# Patient Record
Sex: Male | Born: 1995 | Race: White | Hispanic: Yes | Marital: Single | Smoking: Current every day smoker
Health system: Southern US, Community
[De-identification: ages and names within clinical notes are randomized; demographics above are authoritative.]

## PROBLEM LIST (undated history)

## (undated) DIAGNOSIS — J45909 Unspecified asthma, uncomplicated: Secondary | ICD-10-CM

---
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
## 2017-04-21 ENCOUNTER — Telehealth: Payer: Self-pay | Admitting: Pediatrics

## 2017-04-21 NOTE — Telephone Encounter (Signed)
Mom came by requesting to have a DMV form completed. Please call [name] at [phone] or mom at [phone] when it is finished.

## 2017-04-21 NOTE — Telephone Encounter (Signed)
PE with Dr. Zenda AlpersSawyer 11/27/16 states "DMV paperwork filled out" but nothing appears scanned into media tab; DMV letter placed in Dr. Orlean BradfordProse's folder for review.

## 2017-04-23 ENCOUNTER — Encounter: Payer: Self-pay | Admitting: Pediatrics

## 2017-04-29 NOTE — Telephone Encounter (Signed)
Paperwork completed, signed and faxed by Dr. Lubertha SouthProse. Fax confirmation received.

## 2017-07-22 ENCOUNTER — Ambulatory Visit (INDEPENDENT_AMBULATORY_CARE_PROVIDER_SITE_OTHER): Payer: Self-pay | Admitting: *Deleted

## 2017-07-22 DIAGNOSIS — Z23 Encounter for immunization: Secondary | ICD-10-CM

## 2017-09-07 ENCOUNTER — Telehealth: Payer: Self-pay | Admitting: Pediatrics

## 2017-09-07 NOTE — Telephone Encounter (Signed)
Pt came in and drop off [name] DMV driving fitness form to be filled out. Please call Pt at [phone] with any question.

## 2017-09-09 ENCOUNTER — Encounter: Payer: Self-pay | Admitting: Pediatrics

## 2017-09-09 NOTE — Telephone Encounter (Signed)
Dr. Lubertha SouthProse is working on this.

## 2017-09-25 NOTE — Telephone Encounter (Signed)
Nothing found in media tab. Staff messaged Dr Lubertha SouthProse for update.

## 2017-09-28 NOTE — Telephone Encounter (Signed)
Dr. Lubertha SouthProse has completed form and will contact [name] for pick up.

## 2018-04-04 ENCOUNTER — Ambulatory Visit (HOSPITAL_COMMUNITY)
Admission: EM | Admit: 2018-04-04 | Discharge: 2018-04-04 | Disposition: A | Discharge status: Home / Self Care | Payer: Self-pay | Attending: Family Medicine | Admitting: Family Medicine

## 2018-04-04 ENCOUNTER — Other Ambulatory Visit: Payer: Self-pay

## 2018-04-04 ENCOUNTER — Encounter (HOSPITAL_COMMUNITY): Payer: Self-pay | Admitting: Emergency Medicine

## 2018-04-04 DIAGNOSIS — J4521 Mild intermittent asthma with (acute) exacerbation: Secondary | ICD-10-CM

## 2018-04-04 DIAGNOSIS — J4530 Mild persistent asthma, uncomplicated: Secondary | ICD-10-CM

## 2018-04-04 DIAGNOSIS — J4531 Mild persistent asthma with (acute) exacerbation: Secondary | ICD-10-CM

## 2018-04-04 MED ORDER — ALBUTEROL SULFATE HFA 108 (90 BASE) MCG/ACT IN AERS: 2.0000 | INHALATION_SPRAY | Freq: Four times a day (QID) | RESPIRATORY_TRACT | 0 refills | Status: DC | PRN

## 2018-04-04 MED ORDER — BECLOMETHASONE DIPROPIONATE 40 MCG/ACT IN AERS: 1.0000 | INHALATION_SPRAY | Freq: Two times a day (BID) | RESPIRATORY_TRACT | 1 refills | Status: DC

## 2018-04-04 MED ORDER — PREDNISONE 10 MG (21) PO TBPK: ORAL_TABLET | ORAL | 0 refills | Status: DC

## 2018-04-04 MED ORDER — ALBUTEROL SULFATE (2.5 MG/3ML) 0.083% IN NEBU
INHALATION_SOLUTION | RESPIRATORY_TRACT | Status: AC
  Filled 2018-04-04: qty 3

## 2018-04-04 MED ORDER — ALBUTEROL SULFATE (2.5 MG/3ML) 0.083% IN NEBU
2.5000 mg | INHALATION_SOLUTION | Freq: Once | RESPIRATORY_TRACT | Status: AC
  Administered 2018-04-04: 2.5 mg via RESPIRATORY_TRACT

## 2018-04-04 NOTE — ED Notes (Signed)
Pt discharged by provider.

## 2018-04-04 NOTE — ED Triage Notes (Signed)
[facility] a [facility] of exacerbation of his asthma that started yesterday.

## 2018-04-04 NOTE — ED Provider Notes (Signed)
[facility]    CSN: [id number] Arrival date & time: 04/04/18  1413     History   Chief Complaint Chief Complaint  Patient presents with  . Shortness of Breath    HPI Enis SlipperVictor C Jarema is a 22 y.o. male.   Patient is a 22 year old male here for asthma attack onset yesterday. He stopped his Qvar 1 year ago due to lack of refill and have been feeling fine the whole time until now. Current asthma attack is triggered by URI. He reports nose, coughing, congestion, wheezing and shortness of breath.  Albuterol nebulizer treatment was given by clinical staff per protocol and has completed when this clinician entered the room to assess patient. Patient reports feeling much better after the nebulizer treatment.       Past Medical History:  Diagnosis Date  . Asthma    hosp age 60, and then at least 15 times at ED    Patient Active Problem List   Diagnosis Date Noted  . Weight loss 11/12/2016  . Acne vulgaris 10/27/2014  . Perennial allergic rhinitis 08/17/2014  . Asthma 06/05/2014    Past Surgical History:  Procedure Laterality Date  . 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 Authorizing Provider  ibuprofen (ADVIL,MOTRIN) 800 MG tablet Take 1 tablet (800 mg total) by mouth 3 (three) times daily. 11/16/16  Yes Pollina, Canary Brimhristopher J, MD  albuterol (PROAIR HFA) 108 (90 Base) MCG/ACT inhaler Inhale 2 puffs into the lungs every 6 (six) hours as needed for up to 5 days for wheezing. Always use spacer. 04/04/18 04/09/18  Lucia EstelleZheng, [place], NP  beclomethasone (QVAR) 40 MCG/ACT inhaler Inhale 1 puff into the lungs 2 (two) times daily. Always use spacer. 04/04/18 05/04/18  Lucia EstelleZheng, Alioune Hodgkin, NP  predniSONE (STERAPRED UNI-PAK 21 TAB) 10 MG (21) TBPK tablet Take as directed 04/04/18   Lucia EstelleZheng, Zniyah Midkiff, NP    Family History Family History  Problem Relation Age of Onset  . Diabetes Father   .  Asthma Sister   . Asthma Maternal Aunt        great aunts; and several cousins  . Hypertension Maternal Aunt   . Asthma Maternal Grandmother   . Cancer Maternal Grandmother [id number]       lung apparently primary  . Diabetes Maternal Grandmother   . Diabetes Paternal Grandmother   . Diabetes Paternal Grandfather   . Learning disabilities Neg Hx   . Alcohol abuse Neg Hx   . Drug abuse Neg Hx     Social History Social History   Tobacco Use  . Smoking status: Current Every Day Smoker  . Smokeless tobacco: Never Used  . Tobacco comment:  once a day marjuiana  Substance Use Topics  . Alcohol use: No    Alcohol/week: 2.4 oz    Types: 4 Shots of liquor per week  . Drug use: Yes    Types: Marijuana     Allergies   Penicillins   Review of Systems Review of Systems  Constitutional:       As stated in the HPI     Physical Exam Triage Vital Signs ED Triage Vitals  Enc Vitals Group     BP 04/04/18 1456 (!) 141/85     Pulse Rate 04/04/18 1456 92     Resp 04/04/18 1456 18     Temp 04/04/18 1456 99.5  F (37.5 C)     Temp Source 04/04/18 1456 Oral     SpO2 04/04/18 1456 98 %     Weight --      Height --      Head Circumference --      Peak Flow --      Pain Score 04/04/18 1455 2     Pain Loc --      Pain Edu? --      Excl. in GC? --    No data found.  Updated Vital Signs BP (!) 141/85 (BP Location: Left Arm)   Pulse 92   Temp 99.5 F (37.5 C) (Oral)   Resp 18   SpO2 98%   Physical Exam  Constitutional: He is oriented to person, place, and time. He appears well-developed and well-nourished.  Non-toxic appearance. He does not appear ill. No distress.  HENT:  Head: Normocephalic and atraumatic.  Eyes: Pupils are equal, round, and reactive to light.  Neck: Normal range of motion.  Cardiovascular: Normal rate and normal heart sounds.  No murmur heard. Pulmonary/Chest: Effort normal and breath sounds normal. No respiratory distress.  Was wheezing per patient.     Abdominal: Soft. Bowel sounds are normal. There is no tenderness.  Lymphadenopathy:    He has no cervical adenopathy.  Neurological: He is alert and oriented to person, place, and time.  Nursing note and vitals reviewed.    UC Treatments / Results  Labs (all labs ordered are listed, but only abnormal results are displayed) Labs Reviewed - No data to display  EKG None  Radiology No results found.  Procedures Procedures (including critical care time)  Medications Ordered in UC Medications  albuterol (PROVENTIL) (2.5 MG/3ML) 0.083% nebulizer solution 2.5 mg (2.5 mg Nebulization Given 04/04/18 1504)    Initial Impression / Assessment and Plan / UC Course  I have reviewed the triage vital signs and the nursing notes.  Pertinent labs & imaging results that were available during my care of the patient were reviewed by me and considered in my medical decision making (see chart for details).   Final Clinical Impressions(s) / UC Diagnoses   Final diagnoses:  Mild intermittent asthma with exacerbation  Patient responded well to albuterol nebulizer treatment. Lungs were CTA on exam after treatment. We discussed thoroughly about his asthma management. Patient agreed to take 6-day prednisone taper for his current flare up and albuterol inhaler Q6 hr PRN for wheezing.   Agreed to continue to hold the Qvar for now. Only restart Qvar if flare up becomes frequent. Advised to f/u with a PCP but he only has medicaid family plan.     Discharge Instructions     I have send in prednisone and albuterol inhaler electronically to your pharmacy. I decided to print out your Qvar prescription, which is attached to his paperwork.   Please take the medications as discussed. Return or follow up with a primary care doctor if you do not improve.        ED Prescriptions    Medication Sig Dispense Auth. Provider   beclomethasone (QVAR) 40 MCG/ACT inhaler Inhale 1 puff into the lungs 2 (two)  times daily. Always use spacer. 1 Inhaler Lucia Estelle, NP   predniSONE (STERAPRED UNI-PAK 21 TAB) 10 MG (21) TBPK tablet Take as directed 21 tablet Lucia Estelle, NP   albuterol (PROAIR HFA) 108 (90 Base) MCG/ACT inhaler Inhale 2 puffs into the lungs every 6 (six) hours as needed for up to 5 days  for wheezing. Always use spacer. 1 Inhaler Lucia Estelle, NP     Controlled Substance Prescriptions [place] Controlled Substance Registry consulted? Not Applicable   Lucia Estelle, NP 04/04/18 1531

## 2018-04-04 NOTE — Discharge Instructions (Addendum)
I have send in prednisone and albuterol inhaler electronically to your pharmacy. I decided to print out your Qvar prescription, which is attached to his paperwork.   Please take the medications as discussed. Return or follow up with a primary care doctor if you do not improve.
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
## 2020-06-14 ENCOUNTER — Encounter: Payer: Self-pay | Admitting: Pediatrics

## 2022-09-14 ENCOUNTER — Other Ambulatory Visit: Payer: Self-pay

## 2022-09-14 ENCOUNTER — Emergency Department (HOSPITAL_COMMUNITY)
Admission: EM | Admit: 2022-09-14 | Discharge: 2022-09-14 | Disposition: A | Discharge status: Home / Self Care | Payer: Medicaid Other | Attending: Student | Admitting: Student

## 2022-09-14 ENCOUNTER — Emergency Department (HOSPITAL_COMMUNITY): Payer: Medicaid Other

## 2022-09-14 ENCOUNTER — Encounter (HOSPITAL_COMMUNITY): Payer: Self-pay

## 2022-09-14 DIAGNOSIS — F17200 Nicotine dependence, unspecified, uncomplicated: Secondary | ICD-10-CM | POA: Diagnosis not present

## 2022-09-14 DIAGNOSIS — S52615A Nondisplaced fracture of left ulna styloid process, initial encounter for closed fracture: Secondary | ICD-10-CM | POA: Insufficient documentation

## 2022-09-14 DIAGNOSIS — J45909 Unspecified asthma, uncomplicated: Secondary | ICD-10-CM | POA: Insufficient documentation

## 2022-09-14 DIAGNOSIS — Y92410 Unspecified street and highway as the place of occurrence of the external cause: Secondary | ICD-10-CM | POA: Insufficient documentation

## 2022-09-14 DIAGNOSIS — S060X0A Concussion without loss of consciousness, initial encounter: Secondary | ICD-10-CM | POA: Diagnosis not present

## 2022-09-14 DIAGNOSIS — S0990XA Unspecified injury of head, initial encounter: Secondary | ICD-10-CM | POA: Diagnosis present

## 2022-09-14 MED ORDER — NAPROXEN 375 MG PO TABS: 375.0000 mg | ORAL_TABLET | Freq: Two times a day (BID) | ORAL | 0 refills | Status: DC

## 2022-09-14 MED ORDER — ONDANSETRON 4 MG PO TBDP: 4.0000 mg | ORAL_TABLET | Freq: Three times a day (TID) | ORAL | 0 refills | Status: DC | PRN

## 2022-09-14 MED ORDER — ONDANSETRON 4 MG PO TBDP
4.0000 mg | ORAL_TABLET | Freq: Once | ORAL | Status: AC
  Administered 2022-09-14: 4 mg via ORAL
  Filled 2022-09-14: qty 1
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
## 2022-09-14 NOTE — Progress Notes (Signed)
Orthopedic Tech Progress Note Patient Details:  KASTEN LEVEQUE [birth year] [id number]  Ortho Devices Type of Ortho Device: Velcro wrist splint Ortho Device/Splint Location: LUE Ortho Device/Splint Interventions: Ordered, Application, Adjustment   Post Interventions Patient Tolerated: Well Instructions Provided: Adjustment of device, Care of device, Poper ambulation with device  Vegas Coffin 09/14/2022, 2:30 PM
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
## 2022-09-29 ENCOUNTER — Ambulatory Visit (INDEPENDENT_AMBULATORY_CARE_PROVIDER_SITE_OTHER): Payer: Medicaid Other | Admitting: Physician Assistant

## 2022-09-29 ENCOUNTER — Ambulatory Visit (INDEPENDENT_AMBULATORY_CARE_PROVIDER_SITE_OTHER): Payer: Medicaid Other

## 2022-09-29 ENCOUNTER — Encounter: Payer: Self-pay | Admitting: Physician Assistant

## 2022-09-29 DIAGNOSIS — M25532 Pain in left wrist: Secondary | ICD-10-CM
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
## 2022-10-07 ENCOUNTER — Emergency Department (HOSPITAL_COMMUNITY)
Admission: EM | Admit: 2022-10-07 | Discharge: 2022-10-07 | Discharge status: Against Medical Advice | Payer: Medicaid Other | Attending: Emergency Medicine | Admitting: Emergency Medicine

## 2022-10-07 ENCOUNTER — Other Ambulatory Visit: Payer: Self-pay

## 2022-10-07 DIAGNOSIS — Z5321 Procedure and treatment not carried out due to patient leaving prior to being seen by health care provider: Secondary | ICD-10-CM | POA: Insufficient documentation

## 2022-10-07 DIAGNOSIS — R1010 Upper abdominal pain, unspecified: Secondary | ICD-10-CM | POA: Insufficient documentation

## 2022-10-07 DIAGNOSIS — R1110 Vomiting, unspecified: Secondary | ICD-10-CM | POA: Diagnosis not present

## 2022-10-07 LAB — CBC
HCT: 44.1 % (ref 39.0–52.0)
Hemoglobin: 15.1 g/dL (ref 13.0–17.0)
MCH: 30.9 pg (ref 26.0–34.0)
MCHC: 34.2 g/dL (ref 30.0–36.0)
MCV: 90.4 fL (ref 80.0–100.0)
Platelets: 196 10*3/uL (ref 150–400)
RBC: 4.88 MIL/uL (ref 4.22–5.81)
RDW: 12.4 % (ref 11.5–15.5)
WBC: 8.4 10*3/uL (ref 4.0–10.5)
nRBC: 0 % (ref 0.0–0.2)

## 2022-10-07 LAB — COMPREHENSIVE METABOLIC PANEL
ALT: 31 U/L (ref 0–44)
AST: 62 U/L — ABNORMAL HIGH (ref 15–41)
Albumin: 4.2 g/dL (ref 3.5–5.0)
Alkaline Phosphatase: 84 U/L (ref 38–126)
Anion gap: 8 (ref 5–15)
BUN: 13 mg/dL (ref 6–20)
CO2: 29 mmol/L (ref 22–32)
Calcium: 9.2 mg/dL (ref 8.9–10.3)
Chloride: 106 mmol/L (ref 98–111)
Creatinine, Ser: 0.88 mg/dL (ref 0.61–1.24)
GFR, Estimated: >60 mL/min (ref >60)
Glucose, Bld: 132 mg/dL — ABNORMAL HIGH (ref 70–99)
Potassium: 3.4 mmol/L — ABNORMAL LOW (ref 3.5–5.1)
Sodium: 143 mmol/L (ref 135–145)
Total Bilirubin: 0.9 mg/dL (ref 0.3–1.2)
Total Protein: 6.9 g/dL (ref 6.5–8.1)

## 2022-10-07 LAB — URINALYSIS, ROUTINE W REFLEX MICROSCOPIC
Bilirubin Urine: NEGATIVE
Glucose, UA: NEGATIVE mg/dL
Hgb urine dipstick: NEGATIVE
Ketones, ur: NEGATIVE mg/dL
Leukocytes,Ua: NEGATIVE
Nitrite: NEGATIVE
Protein, ur: NEGATIVE mg/dL
Specific Gravity, Urine: 1.019 (ref 1.005–1.030)
pH: 8 (ref 5.0–8.0)

## 2022-10-07 LAB — LIPASE, BLOOD: Lipase: 47 U/L (ref 11–51)

## 2022-10-07 NOTE — ED Triage Notes (Signed)
Patient arrived with EMS from home reports pain across upper abdomen this evening with emesis x2 , no fever or diarrhea .

## 2022-10-07 NOTE — ED Notes (Signed)
Pt stated he was leaving. Pt IV removed by this tech.

## 2022-10-27 ENCOUNTER — Ambulatory Visit (INDEPENDENT_AMBULATORY_CARE_PROVIDER_SITE_OTHER): Payer: Medicaid Other | Admitting: Physician Assistant

## 2022-10-27 ENCOUNTER — Encounter: Payer: Self-pay | Admitting: Physician Assistant

## 2022-10-27 ENCOUNTER — Ambulatory Visit: Payer: Self-pay

## 2022-10-27 DIAGNOSIS — M25532 Pain in left wrist: Secondary | ICD-10-CM

## 2022-10-27 NOTE — Progress Notes (Deleted)
   Office Visit Note   Patient: Corey Rodgers           Date of Birth: 25-Oct-1995           MRN: [id number] Visit Date: 10/27/2022              Requested by: Christean Leaf, MD No address on file PCP: Pcp, No   Assessment & Plan: Visit Diagnoses:  1. Pain in left wrist     Plan: ***  Follow-Up Instructions: No follow-ups on file.   Orders:  Orders Placed This Encounter  Procedures   XR Wrist Complete Left   No orders of the defined types were placed in this encounter.     Procedures: No procedures performed   Clinical Data: No additional findings.   Subjective: Chief Complaint  Patient presents with   Left Wrist - Follow-up    HPI  Review of Systems   Objective: Vital Signs: There were no vitals taken for this visit.  Physical Exam  Ortho Exam  Specialty Comments:  No specialty comments available.  Imaging: No results found.   PMFS History: Patient Active Problem List   Diagnosis Date Noted   Weight loss 11/12/2016   Acne vulgaris 10/27/2014   Perennial allergic rhinitis 08/17/2014   Asthma 06/05/2014   Past Medical History:  Diagnosis Date   Asthma    hosp age 38, and then at least 57 times at ED    Family History  Problem Relation Age of Onset   Diabetes Father    Asthma Sister    Asthma Maternal Aunt        great aunts; and several cousins   Hypertension Maternal Aunt    Asthma Maternal Grandmother    Cancer Maternal Grandmother 38       lung apparently primary   Diabetes Maternal Grandmother    Diabetes Paternal Grandmother    Diabetes Paternal Grandfather    Learning disabilities Neg Hx    Alcohol abuse Neg Hx    Drug abuse Neg Hx     Past Surgical History:  Procedure Laterality Date   thyroglosal duct cyst  2003   excised   Social History   Occupational History   Not on file  Tobacco Use   Smoking status: Every Day   Smokeless tobacco: Never   Tobacco comments:     once a day marjuiana  Substance and  Sexual Activity   Alcohol use: No    Alcohol/week: 4.0 standard drinks of alcohol    Types: 4 Shots of liquor per week   Drug use: Yes    Types: Marijuana   Sexual activity: Never
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
## 2022-11-03 ENCOUNTER — Ambulatory Visit: Payer: Self-pay | Admitting: Nurse Practitioner

## 2022-11-27 ENCOUNTER — Ambulatory Visit (INDEPENDENT_AMBULATORY_CARE_PROVIDER_SITE_OTHER): Payer: Medicaid Other

## 2022-11-27 ENCOUNTER — Encounter: Payer: Self-pay | Admitting: Physician Assistant

## 2022-11-27 ENCOUNTER — Ambulatory Visit (INDEPENDENT_AMBULATORY_CARE_PROVIDER_SITE_OTHER): Payer: Medicaid Other | Admitting: Physician Assistant

## 2022-11-27 DIAGNOSIS — M25532 Pain in left wrist: Secondary | ICD-10-CM

## 2022-11-27 NOTE — Progress Notes (Signed)
HPI: Corey Rodgers returns today for follow-up of his left wrist.  He states his left wrist is overall doing well.  He is back to review.  He is no longer using the wrist splint.  He states he has been back to review for the last few weeks without any significant pain.  Review of systems: See HPI otherwise negative  Physical exam general well-developed well-nourished male no acute distress. Right wrist full range of motion without pain.  He is nontender over the distal ulna.  He has full supination pronation of the left forearm.  Full range of motion of the elbow without pain.  Hand neurovascular intact.  Radiographs: 3 views left wrist show the ulnar styloid fracture with good consolidation.  There is no other fractures identified.  Wrist is well located.  No other bony abnormalities.  Impression: Left ulnar styloid fracture  Plan: He is activities as tolerated.  Follow-up as needed.  Questions were encouraged and answered.

## 2022-12-18 ENCOUNTER — Encounter: Payer: Self-pay | Admitting: Radiology

## 2023-10-05 ENCOUNTER — Encounter (HOSPITAL_COMMUNITY): Payer: Self-pay | Admitting: *Deleted

## 2023-10-05 ENCOUNTER — Ambulatory Visit (HOSPITAL_COMMUNITY)
Admission: EM | Admit: 2023-10-05 | Discharge: 2023-10-05 | Disposition: A | Discharge status: Home / Self Care | Payer: Medicaid Other | Attending: Physician Assistant | Admitting: Physician Assistant

## 2023-10-05 ENCOUNTER — Other Ambulatory Visit: Payer: Self-pay

## 2023-10-05 DIAGNOSIS — K1379 Other lesions of oral mucosa: Secondary | ICD-10-CM | POA: Diagnosis not present

## 2023-10-05 MED ORDER — NYSTATIN 100000 UNIT/ML MT SUSP: 5.0000 mL | Freq: Four times a day (QID) | OROMUCOSAL | 0 refills | Status: AC | PRN

## 2023-10-05 NOTE — ED Triage Notes (Signed)
Pt reports pain in the upper part of mouth. Pt reports he can not eat due to pain in the roof of mouth.

## 2023-10-05 NOTE — ED Provider Notes (Signed)
[facility]    CSN: [id number] Arrival date & time: 10/05/23  1119      History   Chief Complaint Chief Complaint  Patient presents with   Dental Pain    HPI Corey Rodgers is a 27 y.o. male.   Patient presents today with a history of pain along the roof of his mouth.  He denies any known trauma or thermal injury to the area.  Denies any changes to personal hygiene products including toothpaste or mouthwash.  He has no pain with mastication and denies any pain around a tooth; it is only on his hard palate when something touches the area where he attempts to swallow.  He is able to manage his secretions but has had decreased oral intake.  During episodes pain is rated 7 on a 0-10 pain scale, described as sharp, no alleviating factors identified.  He denies any swelling of his throat, shortness of breath, muffled voice.  He denies any recent antibiotics or steroid use.  He does have a remote history of asthma but does not take inhaled corticosteroids.  He denies history of HIV or diabetes; does report that he was recently diagnosed with prediabetes.  He denies any chemotherapy exposure.    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 Authorizing Provider  magic mouthwash (nystatin, lidocaine, diphenhydrAMINE) suspension Take 5 mLs by mouth 4 (four) times daily as needed for mouth pain. Swish and spit. 10/05/23  Yes Fritzie Prioleau, Noberto Retort, PA-C    Family History Family History  Problem Relation Age of Onset   Diabetes Father    Asthma Sister    Asthma  Maternal Aunt        great aunts; and several cousins   Hypertension Maternal Aunt    Asthma Maternal Grandmother    Cancer Maternal Grandmother 37       lung apparently primary   Diabetes Maternal Grandmother    Diabetes Paternal Grandmother    Diabetes Paternal Grandfather    Learning disabilities Neg Hx    Alcohol abuse Neg Hx    Drug abuse Neg Hx     Social History Social History   Tobacco Use   Smoking status: Every Day   Smokeless tobacco: Never   Tobacco comments:     once a day marjuiana  Substance Use Topics   Alcohol use: No    Alcohol/week: 4.0 standard drinks of alcohol    Types: 4 Shots of liquor per week   Drug use: Yes    Types: Marijuana     Allergies   Penicillins   Review of Systems Review of Systems  Constitutional:  Negative for activity change, appetite change, fatigue and fever.  HENT:  Positive for sore throat. Negative for congestion, sinus pressure, sneezing, trouble swallowing and voice change.   Respiratory:  Negative for cough and shortness of breath.   Cardiovascular:  Negative for chest pain.  Gastrointestinal:  Negative for abdominal pain, diarrhea, nausea and  vomiting.     Physical Exam Triage Vital Signs ED Triage Vitals  Encounter Vitals Group     BP 10/05/23 1135 (!) 146/98     Systolic BP Percentile --      Diastolic BP Percentile --      Pulse Rate 10/05/23 1135 61     Resp 10/05/23 1135 18     Temp 10/05/23 1135 97.9 F (36.6 C)     Temp src --      SpO2 10/05/23 1135 96 %     Weight --      Height --      Head Circumference --      Peak Flow --      Pain Score 10/05/23 1133 7     Pain Loc --      Pain Education --      Exclude from Growth Chart --    No data found.  Updated Vital Signs BP (!) 146/98   Pulse 61   Temp 97.9 F (36.6 C)   Resp 18   SpO2 96%   Visual Acuity Right Eye Distance:   Left Eye Distance:   Bilateral Distance:    Right Eye Near:   Left Eye Near:    Bilateral Near:      Physical Exam Vitals reviewed.  Constitutional:      General: He is awake.     Appearance: Normal appearance. He is well-developed. He is not ill-appearing.     Comments: Very pleasant male appears stated age in no acute distress sitting comfortably in exam room  HENT:     Head: Normocephalic and atraumatic.     Right Ear: Tympanic membrane, ear canal and external ear normal. Tympanic membrane is not erythematous or bulging.     Left Ear: Tympanic membrane, ear canal and external ear normal. Tympanic membrane is not erythematous or bulging.     Nose: Nose normal.     Mouth/Throat:     Mouth: No oral lesions.     Dentition: Normal dentition.     Tongue: No lesions.     Palate: No lesions.     Pharynx: Uvula midline. Posterior oropharyngeal erythema present. No oropharyngeal exudate or uvula swelling.     Tonsils: No tonsillar exudate or tonsillar abscesses. 1+ on the right. 1+ on the left.     Comments: Significant erythema with fine white film noted hard palate.  Unable to remove film with tongue depressor.  No active ulceration or lesion noted.  No evidence of dental abscess.  No evidence of Ludwig angina. Cardiovascular:     Rate and Rhythm: Normal rate and regular rhythm.     Heart sounds: Normal heart sounds, S1 normal and S2 normal. No murmur heard. Pulmonary:     Effort: Pulmonary effort is normal. No accessory muscle usage or respiratory distress.     Breath sounds: Normal breath sounds. No stridor. No wheezing, rhonchi or rales.     Comments: Clear to auscultation bilaterally Lymphadenopathy:     Head:     Right side of head: No submental, submandibular or tonsillar adenopathy.     Left side of head: No submental, submandibular or tonsillar adenopathy.     Cervical: No cervical adenopathy.  Neurological:     Mental Status: He is alert.  Psychiatric:        Behavior: Behavior is cooperative.      UC Treatments / Results  Labs (all labs ordered are listed, but only  abnormal results  are displayed) Labs Reviewed - No data to display  EKG   Radiology No results found.  Procedures Procedures (including critical care time)  Medications Ordered in UC Medications - No data to display  Initial Impression / Assessment and Plan / UC Course  I have reviewed the triage vital signs and the nursing notes.  Pertinent labs & imaging results that were available during my care of the patient were reviewed by me and considered in my medical decision making (see chart for details).     Patient is well-appearing, afebrile, nontoxic, nontachycardic.  No evidence of dental infection/abscess that would warrant initiation of systemic antibiotics.  Concern for irritation of the hard palate potentially related to thrush given white film with history of prediabetes.  Will treat with Magic mouthwash to help manage the symptoms.  He is to swish and spit this up to 4 times a day and can also use over-the-counter analgesics as well as warm salt water for additional symptom relief.  We discussed that if his symptoms do not improve within a few days or if anything worsens and he has oral lesions, swelling of his throat, shortness of breath, muffled voice, fever he needs to be seen emergently.  Strict return precautions given.  Work excuse note provided.  All questions answered to patient satisfaction.  Final Clinical Impressions(s) / UC Diagnoses   Final diagnoses:  Pain of internal part of mouth     Discharge Instructions      Use Magic mouthwash up to 4 times a day.  Swish and spit this out.  You can also use Tylenol/ibuprofen and gargling with warm salt water for additional symptom relief.  If your symptoms are not improving within 2 to 3 days or if anything worsens and you have swelling of your throat, difficulty swallowing, change in your voice, high fever, lesions in your mouth you should be seen immediately.     ED Prescriptions     Medication Sig Dispense Auth.  Provider   magic mouthwash (nystatin, lidocaine, diphenhydrAMINE) suspension Take 5 mLs by mouth 4 (four) times daily as needed for mouth pain. Swish and spit. 180 mL Atley Neubert K, PA-C      PDMP not reviewed this encounter.   Jeani Hawking, PA-C 10/05/23 1157

## 2023-10-05 NOTE — Discharge Instructions (Addendum)
Use Magic mouthwash up to 4 times a day.  Swish and spit this out.  You can also use Tylenol/ibuprofen and gargling with warm salt water for additional symptom relief.  If your symptoms are not improving within 2 to 3 days or if anything worsens and you have swelling of your throat, difficulty swallowing, change in your voice, high fever, lesions in your mouth you should be seen immediately.
